# Patient Record
Sex: Male | Born: 1995 | Race: Black or African American | Hispanic: No | Marital: Single | Smoking: Never smoker
Health system: Southern US, Community
[De-identification: ages and names within clinical notes are randomized; demographics above are authoritative.]

## PROBLEM LIST (undated history)

## (undated) ENCOUNTER — Emergency Department (HOSPITAL_BASED_OUTPATIENT_CLINIC_OR_DEPARTMENT_OTHER): Admission: EM | Payer: Managed Care, Other (non HMO) | Source: Home / Self Care

---
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
## 2017-09-27 ENCOUNTER — Other Ambulatory Visit: Payer: Self-pay

## 2017-09-27 ENCOUNTER — Emergency Department (HOSPITAL_COMMUNITY)
Admission: EM | Admit: 2017-09-27 | Discharge: 2017-09-27 | Disposition: A | Discharge status: Home / Self Care | Payer: Managed Care, Other (non HMO) | Attending: Emergency Medicine | Admitting: Emergency Medicine

## 2017-09-27 ENCOUNTER — Encounter (HOSPITAL_COMMUNITY): Payer: Self-pay | Admitting: Emergency Medicine

## 2017-09-27 DIAGNOSIS — J309 Allergic rhinitis, unspecified: Secondary | ICD-10-CM | POA: Diagnosis not present

## 2017-09-27 DIAGNOSIS — R0981 Nasal congestion: Secondary | ICD-10-CM | POA: Diagnosis present

## 2017-09-27 NOTE — Discharge Instructions (Addendum)
We recommend that you take Zyrtec or Claritin daily to prevent recurrent symptoms.  Follow-up with an allergy specialist if you desire further evaluation of your symptoms and/or allergy testing.  If your symptoms are not well controlled with Zyrtec or Claritin, you may use Benadryl as needed.  Take Benadryl as prescribed on the box.  Return for any new or concerning symptoms.

## 2017-09-27 NOTE — ED Triage Notes (Signed)
Patient comes in with complaints of an allergic reaction. 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 Patient states that he began having head pressure, nasal congestion and itching and felt like his "ears were clogged." EMS was called and patient was given 50 mg benedryl. Patient no longer having symptoms just "wants to know what happened."

## 2017-09-27 NOTE — ED Provider Notes (Signed)
[facility]-EMERGENCY DEPT Provider Note   CSN: [id number] Arrival date & time: 09/27/17  1854     History   Chief Complaint Chief Complaint  Patient presents with  . Allergic Reaction    HPI Edward Bernard is a 22 y.o. male.  22 year old male presents to the emergency department for evaluation of allergic reaction.  He denies a history of seasonal allergies, but states that he was running through the woods with a kickboxing class.  He started to develop nasal congestion as well as a pressure-like, frontal headache with ear pressure.  This was further associated with puffy, watery eyes and a scratchy throat.  He noticed a hive on his left arm.  The patient received 50 mg of Benadryl at 1700.  This has completely resolved his aforementioned symptoms.  He denies any difficulty breathing or swallowing at present.  No history of anaphylactic reaction.  The history is provided by the patient. No language interpreter was used.  Allergic Reaction    History reviewed. No pertinent past medical history.  Patient Active Problem List   Diagnosis Date Noted  . Infection of skin of left ear lobe 11/30/2012    History reviewed. No pertinent surgical history.      Home Medications    Prior to Admission medications   Medication Sig Start Date End Date Taking? Authorizing Provider  diphenhydrAMINE (BENADRYL) 25 MG tablet Take 50 mg by mouth every 6 (six) hours as needed for allergies.   Yes [provider]  cephALEXin (KEFLEX) 500 MG capsule Take 1 capsule (500 mg total) by mouth 3 (three) times daily. Patient not taking: Reported on 09/27/2017 11/30/12   Ozella RocksMerrell, David J, MD    Family History No family history on file.  Social History Social History   Tobacco Use  . Smoking status: Never Smoker  . Smokeless tobacco: Never Used  Substance Use Topics  . Alcohol use: No  . Drug use: No     Allergies   Patient has no known allergies.   Review  of Systems Review of Systems Ten systems reviewed and are negative for acute change, except as noted in the HPI.    Physical Exam Updated Vital Signs BP 128/82 (BP Location: Left Arm)   Pulse 75   Temp 98.2 F (36.8 C) (Oral)   Resp 18   Ht 5\' 11"  (1.803 m)   Wt 74.8 kg (165 lb)   SpO2 100%   BMI 23.01 kg/m   Physical Exam  Constitutional: He is oriented to person, place, and time. He appears well-developed and well-nourished. No distress.  Nontoxic appearing and in no distress  HENT:  Head: Normocephalic and atraumatic.  No angioedema.  Tolerating secretions without difficulty.  No tripoding or stridor.  Eyes: Conjunctivae and EOM are normal. No scleral icterus.  Neck: Normal range of motion.  Cardiovascular: Normal rate, regular rhythm and intact distal pulses.  Pulmonary/Chest: Effort normal. No stridor. No respiratory distress. He has no wheezes. He has no rales.  Respirations even and unlabored.  Lungs clear bilaterally.  Musculoskeletal: Normal range of motion.  Neurological: He is alert and oriented to person, place, and time. He exhibits normal muscle tone. Coordination normal.  Skin: Skin is warm and dry. No rash noted. He is not diaphoretic. No erythema. No pallor.  Psychiatric: He has a normal mood and affect. His behavior is normal.  Nursing note and vitals reviewed.    ED Treatments / Results  Labs (all labs ordered are  listed, but only abnormal results are displayed) Labs Reviewed - No data to display  EKG None  Radiology No results found.  Procedures Procedures (including critical care time)  Medications Ordered in ED Medications - No data to display   Initial Impression / Assessment and Plan / ED Course  I have reviewed the triage vital signs and the nursing notes.  Pertinent labs & imaging results that were available during my care of the patient were reviewed by me and considered in my medical decision making (see chart for details).      22 year old male presents to the emergency department for evaluation of allergic reaction.  He denies any new soaps, lotions, detergents.  No history of seasonal allergies.  His symptoms sound consistent with allergic rhinitis, suspected to be exposure from running through the woods.  He had complete resolution with Benadryl given prior to arrival.  No difficulty swallowing or breathing at present.  Lungs clear.  No hypoxia.  No angioedema.  Plan for referral to allergy specialist should patient desire allergy testing.  Have recommended the use of daily Zyrtec or Claritin.  I do not believe further emergent work-up is indicated at this time.  Return precautions discussed and provided. Patient discharged in stable condition with no unaddressed concerns.  Vitals:   09/27/17 1925 09/27/17 2231  BP: 131/78 128/82  Pulse: (!) 102 75  Resp: 18 18  Temp: 98.2 F (36.8 C)   TempSrc: Oral   SpO2: 100% 100%  Weight: 74.8 kg (165 lb)   Height: 5\' 11"  (1.803 m)     Final Clinical Impressions(s) / ED Diagnoses   Final diagnoses:  Allergic rhinitis, unspecified seasonality, unspecified trigger    ED Discharge Orders    None       Antony Madura, PA-C 09/27/17 2306    Tegeler, Canary Brim, MD 09/28/17 [phone]

## 2018-08-26 ENCOUNTER — Emergency Department (HOSPITAL_BASED_OUTPATIENT_CLINIC_OR_DEPARTMENT_OTHER)
Admission: EM | Admit: 2018-08-26 | Discharge: 2018-08-26 | Disposition: A | Discharge status: Home / Self Care | Payer: No Typology Code available for payment source | Attending: Emergency Medicine | Admitting: Emergency Medicine

## 2018-08-26 ENCOUNTER — Encounter (HOSPITAL_BASED_OUTPATIENT_CLINIC_OR_DEPARTMENT_OTHER): Payer: Self-pay | Admitting: Emergency Medicine

## 2018-08-26 ENCOUNTER — Other Ambulatory Visit: Payer: Self-pay

## 2018-08-26 ENCOUNTER — Emergency Department (HOSPITAL_BASED_OUTPATIENT_CLINIC_OR_DEPARTMENT_OTHER): Payer: No Typology Code available for payment source

## 2018-08-26 DIAGNOSIS — X501XXA Overexertion from prolonged static or awkward postures, initial encounter: Secondary | ICD-10-CM | POA: Diagnosis not present

## 2018-08-26 DIAGNOSIS — S9031XA Contusion of right foot, initial encounter: Secondary | ICD-10-CM | POA: Insufficient documentation

## 2018-08-26 DIAGNOSIS — S99921A Unspecified injury of right foot, initial encounter: Secondary | ICD-10-CM | POA: Diagnosis present

## 2018-08-26 DIAGNOSIS — Y990 Civilian activity done for income or pay: Secondary | ICD-10-CM | POA: Diagnosis not present

## 2018-08-26 DIAGNOSIS — Y929 Unspecified place or not applicable: Secondary | ICD-10-CM | POA: Diagnosis not present

## 2018-08-26 DIAGNOSIS — Y939 Activity, unspecified: Secondary | ICD-10-CM | POA: Insufficient documentation

## 2018-08-26 NOTE — ED Notes (Signed)
Pt states Workers comp, no urine specimen required to his knowledge. Pt states injury occurred around 1pm yesterday and his work requires him to be checked before returning to work.

## 2018-08-26 NOTE — ED Provider Notes (Signed)
[facility] EMERGENCY DEPARTMENT Provider Note   CSN: [id number] Arrival date & time: 08/26/18  1347    History   Chief Complaint Chief Complaint  Patient presents with  . Foot Injury    HPI Edward Bernard is a 23 y.o. male.     The history is provided by the patient.  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 No pertinent past medical history.  Patient Active Problem List   Diagnosis Date Noted  . Infection of skin of left ear lobe 11/30/2012    History reviewed. No pertinent surgical history.      Home Medications    Prior to Admission medications   Medication Sig Start Date End Date Taking? Authorizing Provider  cephALEXin (KEFLEX) 500 MG capsule Take 1 capsule (500 mg total) by mouth 3 (three) times daily. Patient not taking: Reported on 09/27/2017 11/30/12   Ozella Rocks, MD  diphenhydrAMINE (BENADRYL) 25 MG tablet Take 50 mg by mouth every 6 (six) hours as needed for allergies.    [provider]    Family History No family history on file.  Social History Social History   Tobacco Use  . Smoking status: Never Smoker  . Smokeless tobacco: Never Used  Substance Use Topics  . Alcohol use: No  . Drug  use: No     Allergies   Patient has no known allergies.   Review of Systems Review of Systems  Musculoskeletal: Negative for stiffness.  All other systems reviewed and are negative.    Physical Exam Updated Vital Signs BP 128/78 (BP Location: Left Arm)   Pulse 91   Temp 98.4 F (36.9 C) (Oral)   Resp 16   Ht 6' (1.829 m)   Wt 78.5 kg   SpO2 100%   BMI 23.46 kg/m   Physical Exam Vitals signs and nursing note reviewed.  Constitutional:      Appearance: Normal appearance. He is normal weight.  HENT:     Head: Normocephalic.  Cardiovascular:     Rate and Rhythm: Normal rate.     Pulses: Normal pulses.  Pulmonary:     Effort: Pulmonary effort is normal.  Musculoskeletal:        General: Tenderness present.       Feet:  Skin:    General: Skin is warm.     Capillary Refill: Capillary refill takes less than 2 seconds.  Neurological:     General: No focal deficit present.     Mental Status: He is alert and oriented to person, place, and time. Mental status is at baseline.  Psychiatric:        Mood and Affect:  Mood normal.        Behavior: Behavior normal.        Thought Content: Thought content normal.      ED Treatments / Results  Labs (all labs ordered are listed, but only abnormal results are displayed) Labs Reviewed - No data to display  EKG None  Radiology Dg Foot Complete Right  Result Date: 08/26/2018 CLINICAL DATA:  Missed step, pain and swelling lateral and top of foot EXAM: RIGHT FOOT COMPLETE - 3+ VIEW COMPARISON:  None. FINDINGS: There is no evidence of fracture or dislocation. There is no evidence of arthropathy or other focal bone abnormality. Soft tissues are unremarkable. IMPRESSION: No fracture or dislocation of the right foot. Electronically Signed   By: Lauralyn PrimesAlex  Bibbey M.D.   On: 08/26/2018 14:39    Procedures Procedures (including critical care time)  Medications Ordered in ED Medications - No data to display   Initial Impression /  Assessment and Plan / ED Course  I have reviewed the triage vital signs and the nursing notes.  Pertinent labs & imaging results that were available during my care of the patient were reviewed by me and considered in my medical decision making (see chart for details).       Patient with an injury at work yesterday where his foot twisted and he stepped down wrong.  He has swelling and pain over the proximal fifth metatarsal we will do an x-ray to ensure no fracture.  No other injury at this time.  Ankle exam is normal.  3:05 PM X-rays negative for fracture dislocation and patient was treated for contusion and sprain.  Final Clinical Impressions(s) / ED Diagnoses   Final diagnoses:  Contusion of right foot, initial encounter    ED Discharge Orders    None       Gwyneth SproutPlunkett, Samora Jernberg, MD 08/26/18 1505

## 2018-08-26 NOTE — ED Triage Notes (Signed)
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

## 2018-08-26 NOTE — Discharge Instructions (Signed)
Ice and elevate when you can and take tylenol or ibuprofen as needed for pain.

## 2020-03-02 IMAGING — DX RIGHT FOOT COMPLETE - 3+ VIEW
3 series · 3 of 3 positions shown · non-contrast
Comparison: None.

CLINICAL DATA: Missed step, pain and swelling lateral and top of
foot

EXAM:
RIGHT FOOT COMPLETE - 3+ VIEW

[foot ap]
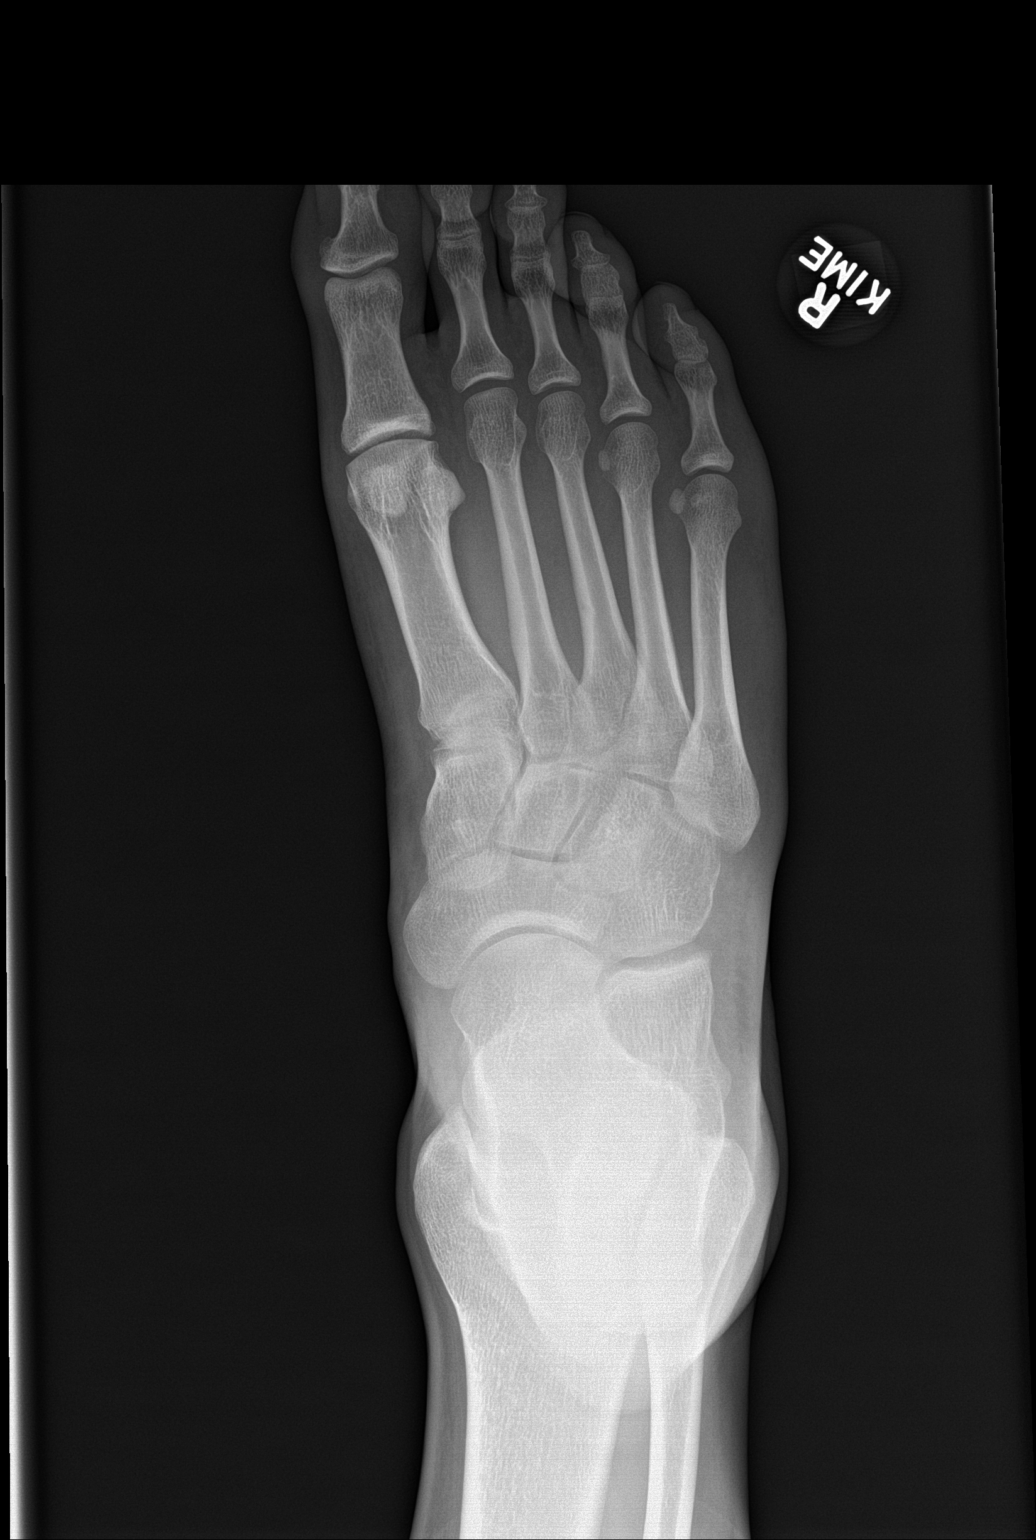

[foot obl]
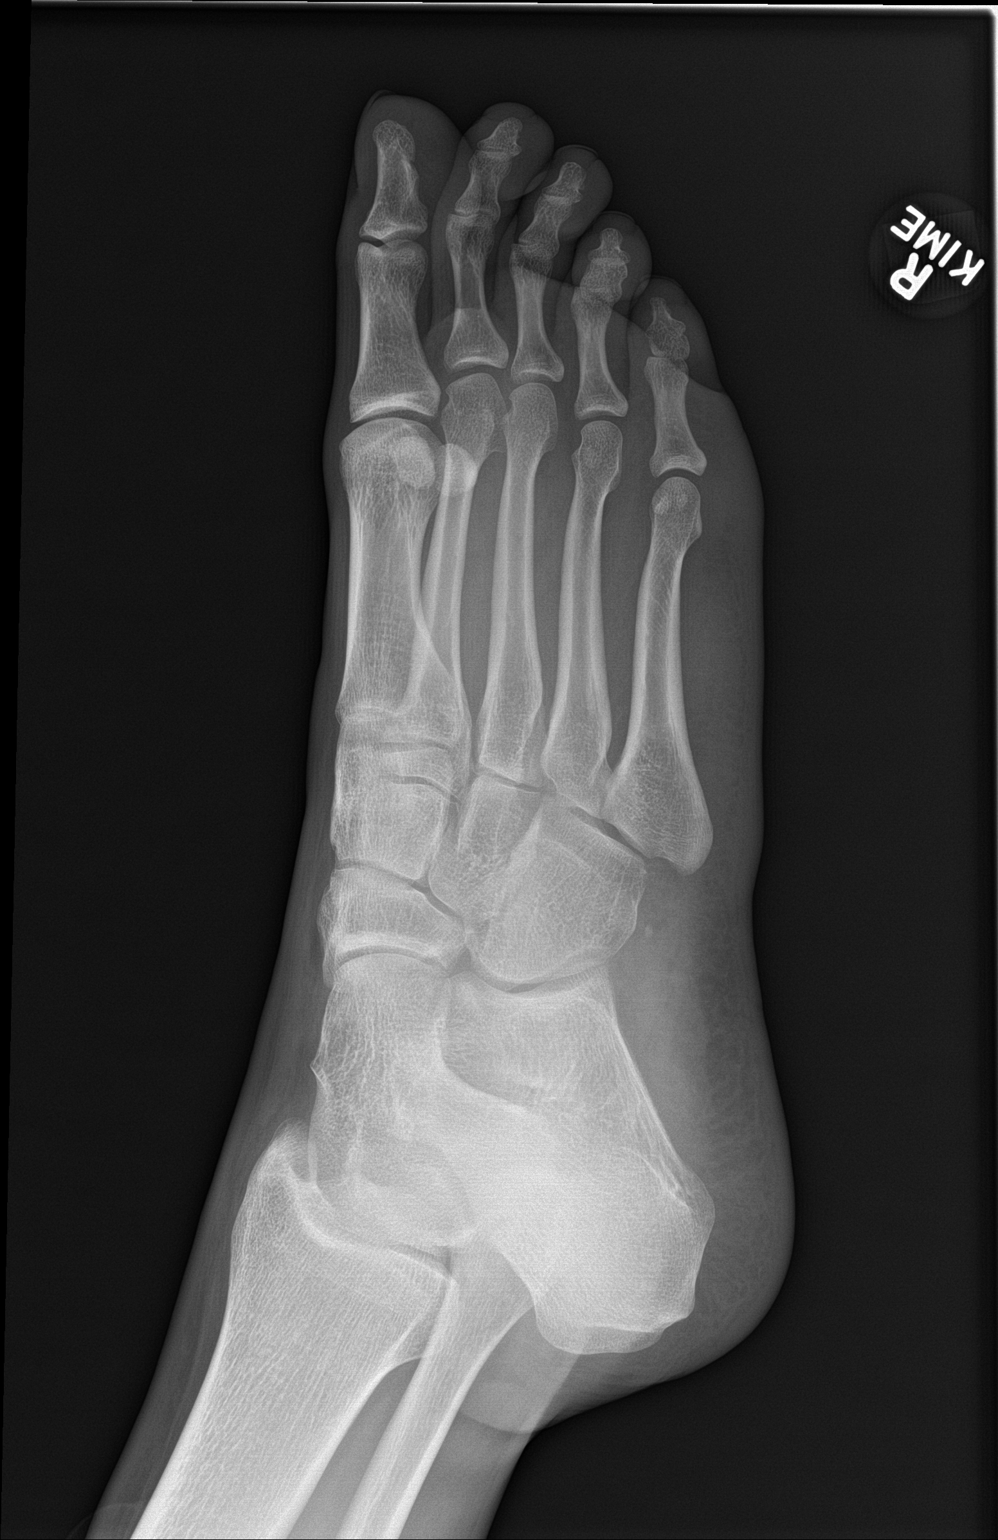

[foot lat]
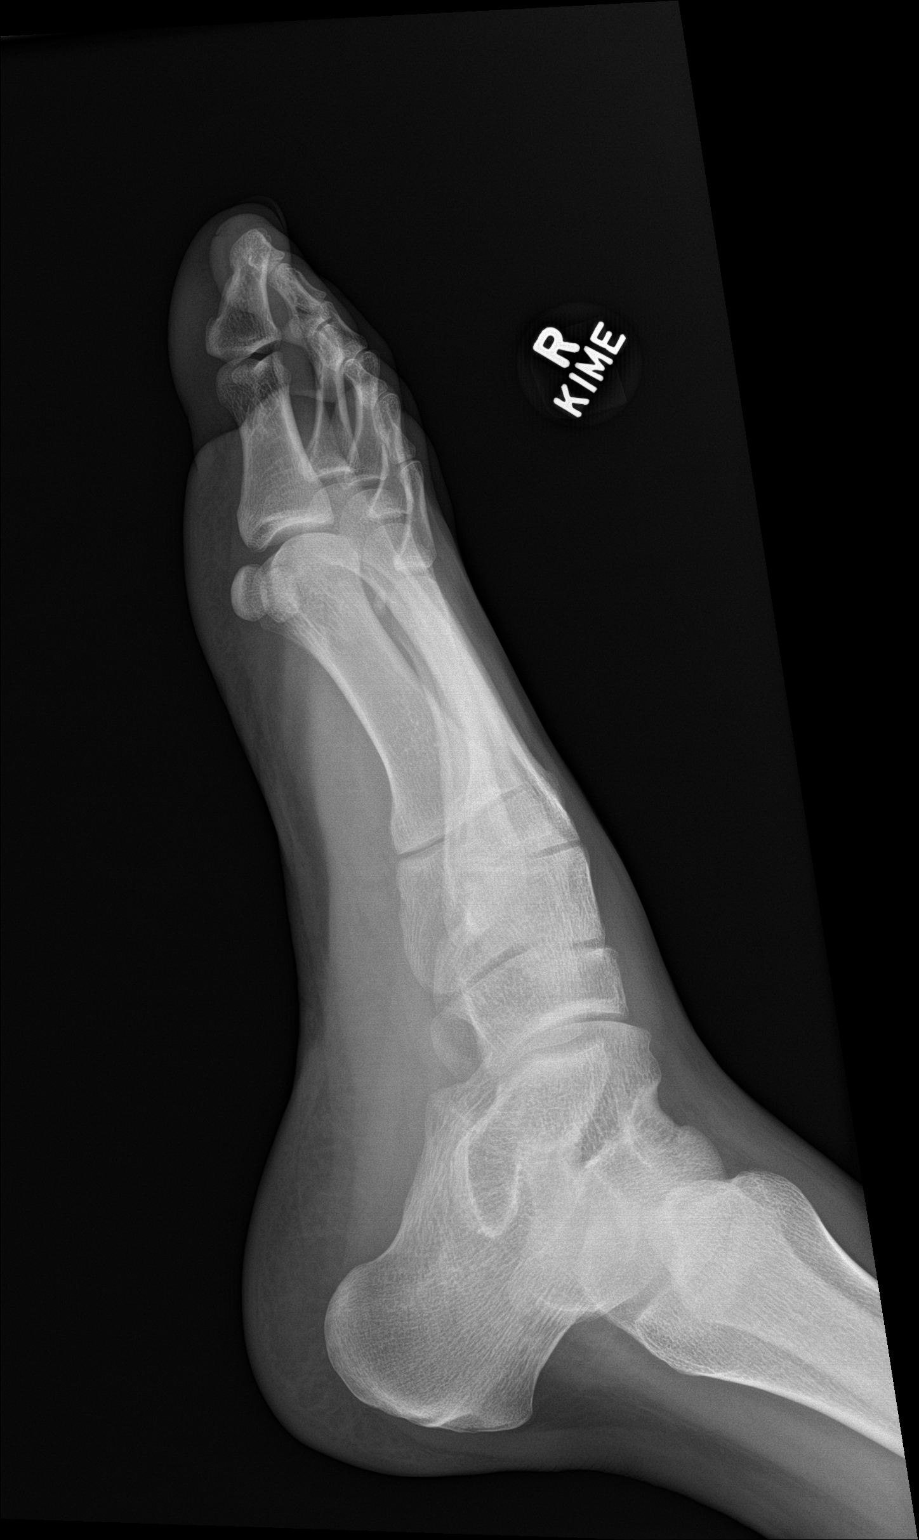

[3 of 3 positions shown; findings below may reference images not displayed]

FINDINGS: There is no evidence of fracture or dislocation. There is no
evidence of arthropathy or other focal bone abnormality. Soft
tissues are unremarkable.
IMPRESSION: No fracture or dislocation of the right foot.

## 2022-07-04 DIAGNOSIS — Z419 Encounter for procedure for purposes other than remedying health state, unspecified: Secondary | ICD-10-CM | POA: Diagnosis not present

## 2022-08-03 DIAGNOSIS — Z419 Encounter for procedure for purposes other than remedying health state, unspecified: Secondary | ICD-10-CM | POA: Diagnosis not present

## 2022-09-03 DIAGNOSIS — Z419 Encounter for procedure for purposes other than remedying health state, unspecified: Secondary | ICD-10-CM | POA: Diagnosis not present

## 2022-10-03 DIAGNOSIS — Z419 Encounter for procedure for purposes other than remedying health state, unspecified: Secondary | ICD-10-CM | POA: Diagnosis not present

## 2022-11-03 DIAGNOSIS — Z419 Encounter for procedure for purposes other than remedying health state, unspecified: Secondary | ICD-10-CM | POA: Diagnosis not present

## 2022-12-04 DIAGNOSIS — Z419 Encounter for procedure for purposes other than remedying health state, unspecified: Secondary | ICD-10-CM | POA: Diagnosis not present

## 2023-01-03 DIAGNOSIS — Z419 Encounter for procedure for purposes other than remedying health state, unspecified: Secondary | ICD-10-CM | POA: Diagnosis not present

## 2023-02-03 DIAGNOSIS — Z419 Encounter for procedure for purposes other than remedying health state, unspecified: Secondary | ICD-10-CM | POA: Diagnosis not present

## 2023-03-05 DIAGNOSIS — Z419 Encounter for procedure for purposes other than remedying health state, unspecified: Secondary | ICD-10-CM | POA: Diagnosis not present

## 2023-04-05 DIAGNOSIS — Z419 Encounter for procedure for purposes other than remedying health state, unspecified: Secondary | ICD-10-CM | POA: Diagnosis not present

## 2023-05-06 DIAGNOSIS — Z419 Encounter for procedure for purposes other than remedying health state, unspecified: Secondary | ICD-10-CM | POA: Diagnosis not present

## 2023-06-03 DIAGNOSIS — Z419 Encounter for procedure for purposes other than remedying health state, unspecified: Secondary | ICD-10-CM | POA: Diagnosis not present

## 2023-07-15 DIAGNOSIS — Z419 Encounter for procedure for purposes other than remedying health state, unspecified: Secondary | ICD-10-CM | POA: Diagnosis not present

## 2023-08-14 DIAGNOSIS — Z419 Encounter for procedure for purposes other than remedying health state, unspecified: Secondary | ICD-10-CM | POA: Diagnosis not present

## 2023-09-14 DIAGNOSIS — Z419 Encounter for procedure for purposes other than remedying health state, unspecified: Secondary | ICD-10-CM | POA: Diagnosis not present

## 2023-10-14 DIAGNOSIS — Z419 Encounter for procedure for purposes other than remedying health state, unspecified: Secondary | ICD-10-CM | POA: Diagnosis not present

## 2023-11-14 DIAGNOSIS — Z419 Encounter for procedure for purposes other than remedying health state, unspecified: Secondary | ICD-10-CM | POA: Diagnosis not present

## 2023-12-15 DIAGNOSIS — Z419 Encounter for procedure for purposes other than remedying health state, unspecified: Secondary | ICD-10-CM | POA: Diagnosis not present

## 2023-12-20 DIAGNOSIS — F4323 Adjustment disorder with mixed anxiety and depressed mood: Secondary | ICD-10-CM | POA: Diagnosis not present

## 2023-12-25 ENCOUNTER — Ambulatory Visit: Admitting: Family Medicine

## 2023-12-27 DIAGNOSIS — F4323 Adjustment disorder with mixed anxiety and depressed mood: Secondary | ICD-10-CM | POA: Diagnosis not present

## 2024-01-03 DIAGNOSIS — F4323 Adjustment disorder with mixed anxiety and depressed mood: Secondary | ICD-10-CM | POA: Diagnosis not present

## 2024-01-10 DIAGNOSIS — F4323 Adjustment disorder with mixed anxiety and depressed mood: Secondary | ICD-10-CM | POA: Diagnosis not present

## 2024-01-17 DIAGNOSIS — F4323 Adjustment disorder with mixed anxiety and depressed mood: Secondary | ICD-10-CM | POA: Diagnosis not present

## 2024-01-24 DIAGNOSIS — F4323 Adjustment disorder with mixed anxiety and depressed mood: Secondary | ICD-10-CM | POA: Diagnosis not present

## 2024-01-31 DIAGNOSIS — F4323 Adjustment disorder with mixed anxiety and depressed mood: Secondary | ICD-10-CM | POA: Diagnosis not present

## 2024-02-07 DIAGNOSIS — F4323 Adjustment disorder with mixed anxiety and depressed mood: Secondary | ICD-10-CM | POA: Diagnosis not present

## 2024-02-28 ENCOUNTER — Ambulatory Visit: Admitting: Family Medicine

## 2024-03-06 DIAGNOSIS — F4323 Adjustment disorder with mixed anxiety and depressed mood: Secondary | ICD-10-CM | POA: Diagnosis not present

## 2024-03-15 DIAGNOSIS — Z419 Encounter for procedure for purposes other than remedying health state, unspecified: Secondary | ICD-10-CM | POA: Diagnosis not present

## 2024-04-23 ENCOUNTER — Ambulatory Visit: Admitting: Family Medicine

## 2024-05-06 ENCOUNTER — Ambulatory Visit: Admitting: Family Medicine

## 2024-05-15 ENCOUNTER — Ambulatory Visit: Admitting: Family Medicine
